# Patient Record
Sex: Female | Born: 1990 | Race: Black or African American | Hispanic: No | Marital: Single | Smoking: Never smoker
Health system: Southern US, Community
[De-identification: ages and names within clinical notes are randomized; demographics above are authoritative.]

## PROBLEM LIST (undated history)

## (undated) ENCOUNTER — Inpatient Hospital Stay (HOSPITAL_COMMUNITY): Payer: Self-pay

## (undated) DIAGNOSIS — G8929 Other chronic pain: Secondary | ICD-10-CM

## (undated) DIAGNOSIS — M549 Dorsalgia, unspecified: Secondary | ICD-10-CM

## (undated) DIAGNOSIS — F419 Anxiety disorder, unspecified: Secondary | ICD-10-CM

## (undated) DIAGNOSIS — R87629 Unspecified abnormal cytological findings in specimens from vagina: Secondary | ICD-10-CM

## (undated) HISTORY — PX: WISDOM TOOTH EXTRACTION: SHX21

---

## 2008-03-01 ENCOUNTER — Emergency Department (HOSPITAL_COMMUNITY): Admission: EM | Admit: 2008-03-01 | Discharge: 2008-03-01 | Payer: Self-pay | Admitting: Emergency Medicine

## 2009-08-22 IMAGING — CR DG LUMBAR SPINE COMPLETE 4+V
5 series · 5 of 5 positions shown · non-contrast
Comparison: None

CLINICAL DATA: Lower back pain

LUMBAR SPINE - COMPLETE 4+ VIEW

[t l-spine a.p.]
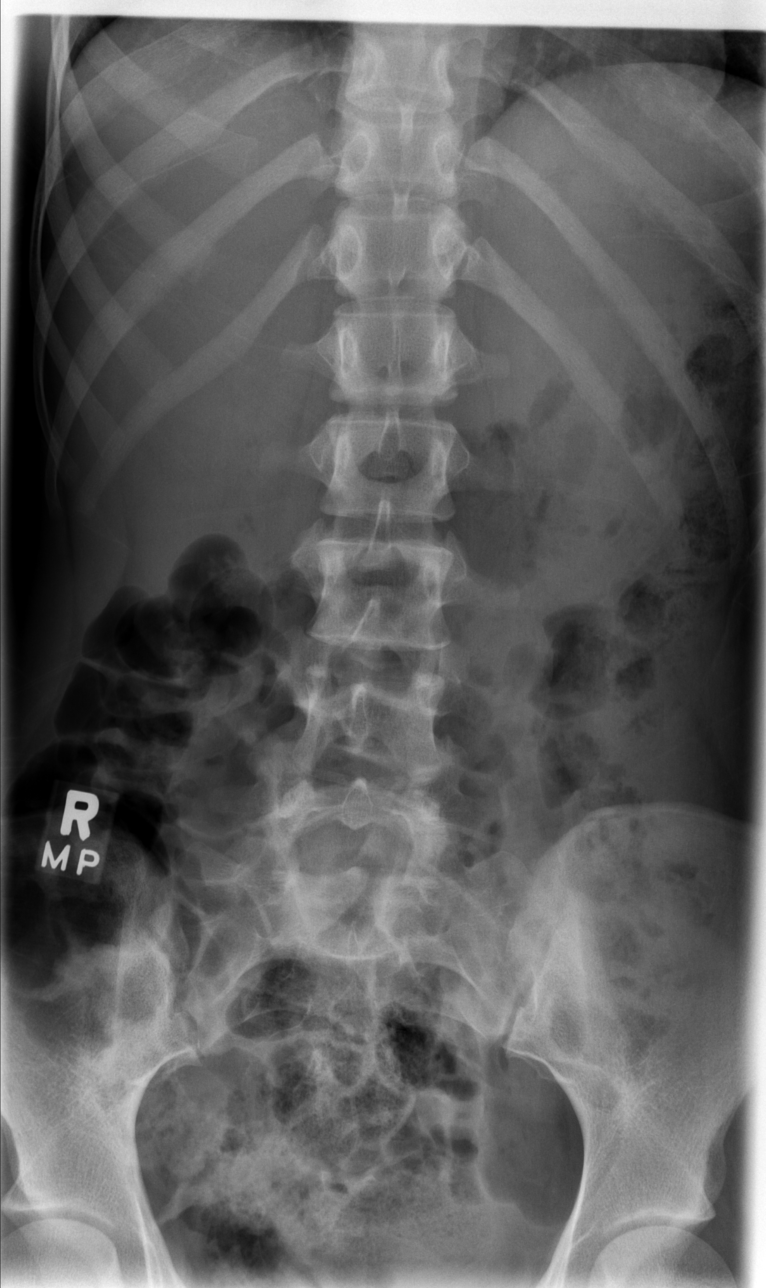

[t l-spine oblique exposure (1 of 2)]
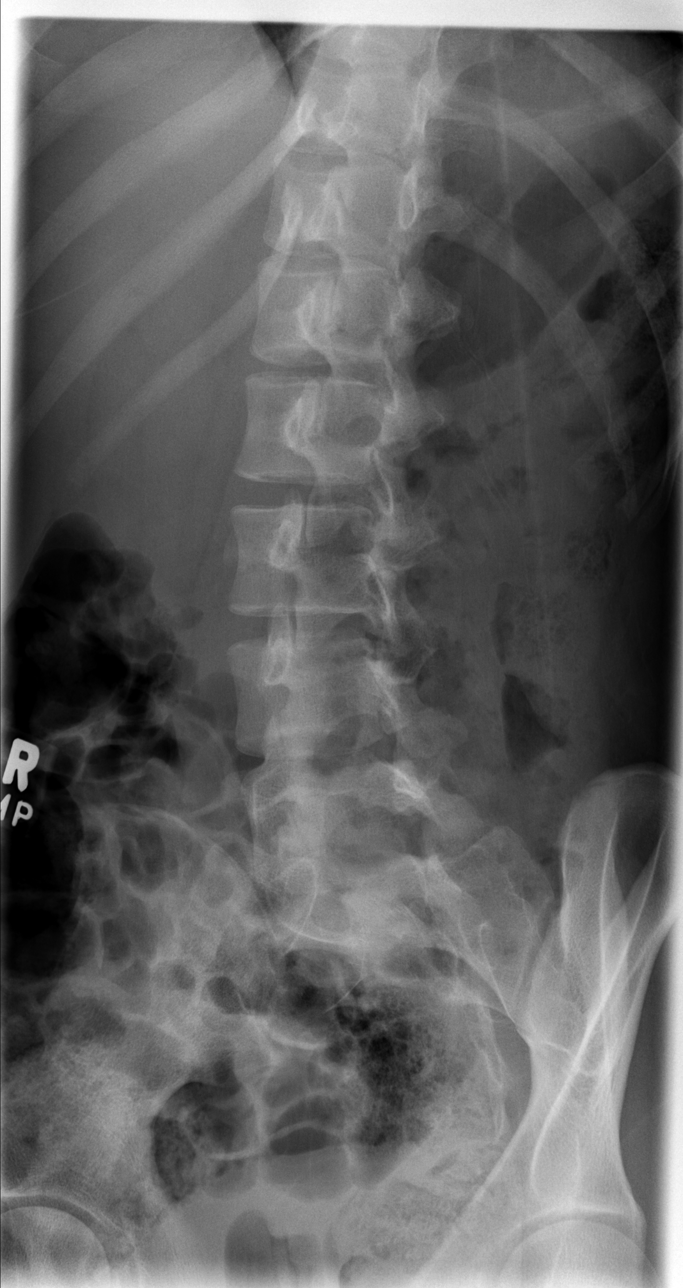

[t l-spine oblique exposure (2 of 2)]
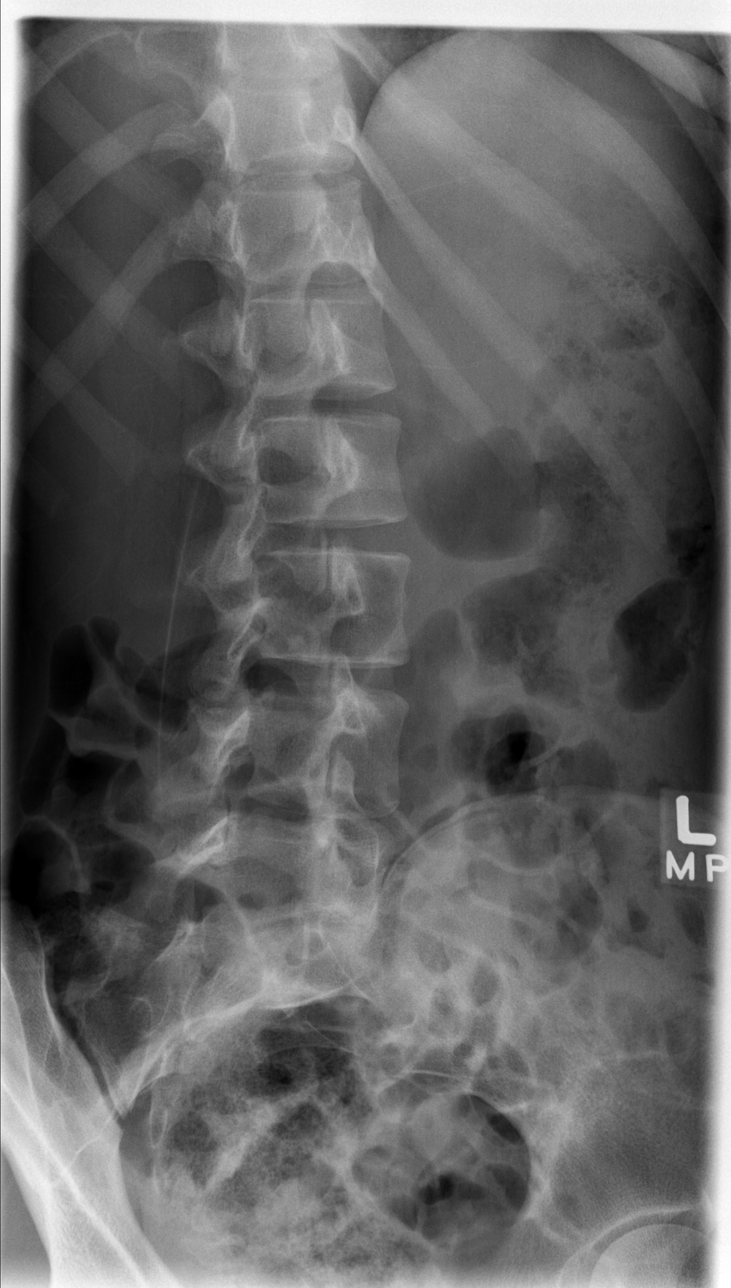

[t l-spine lat]
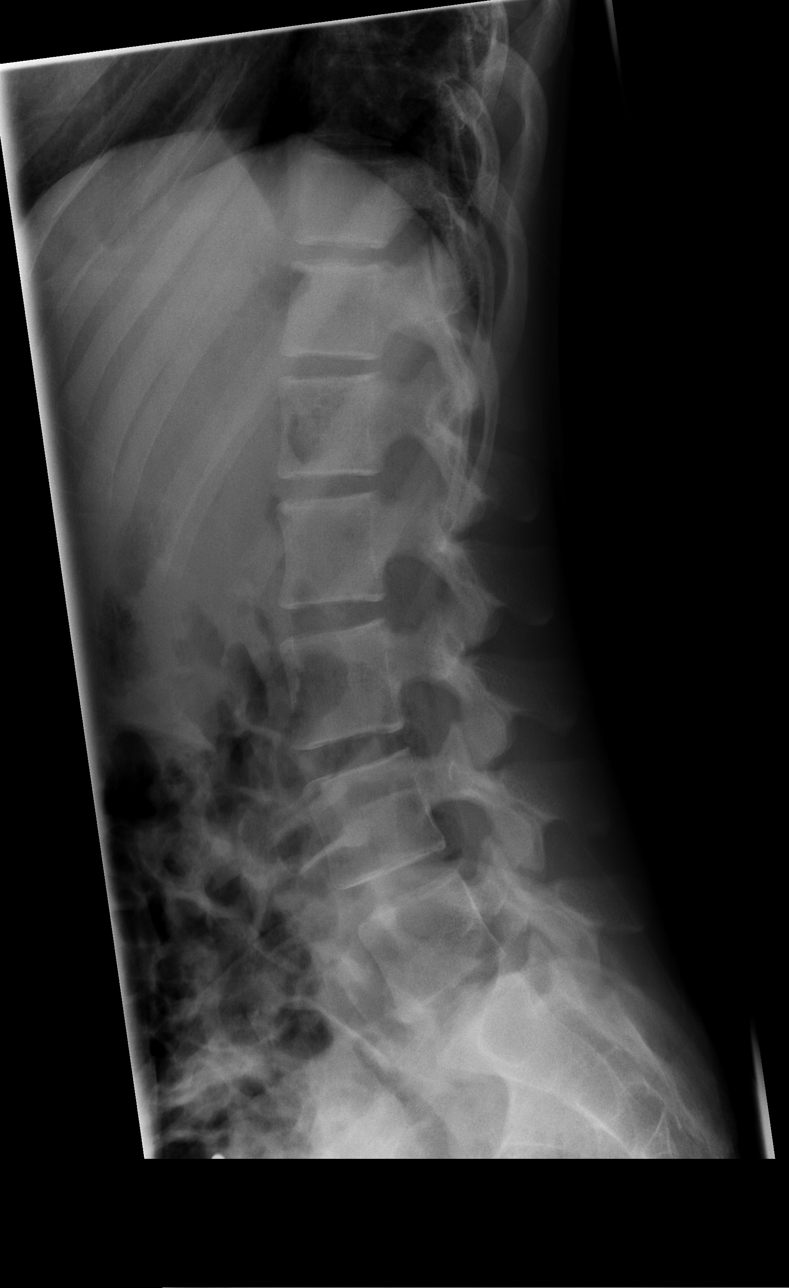

[t l-spine l5-s1 spot]
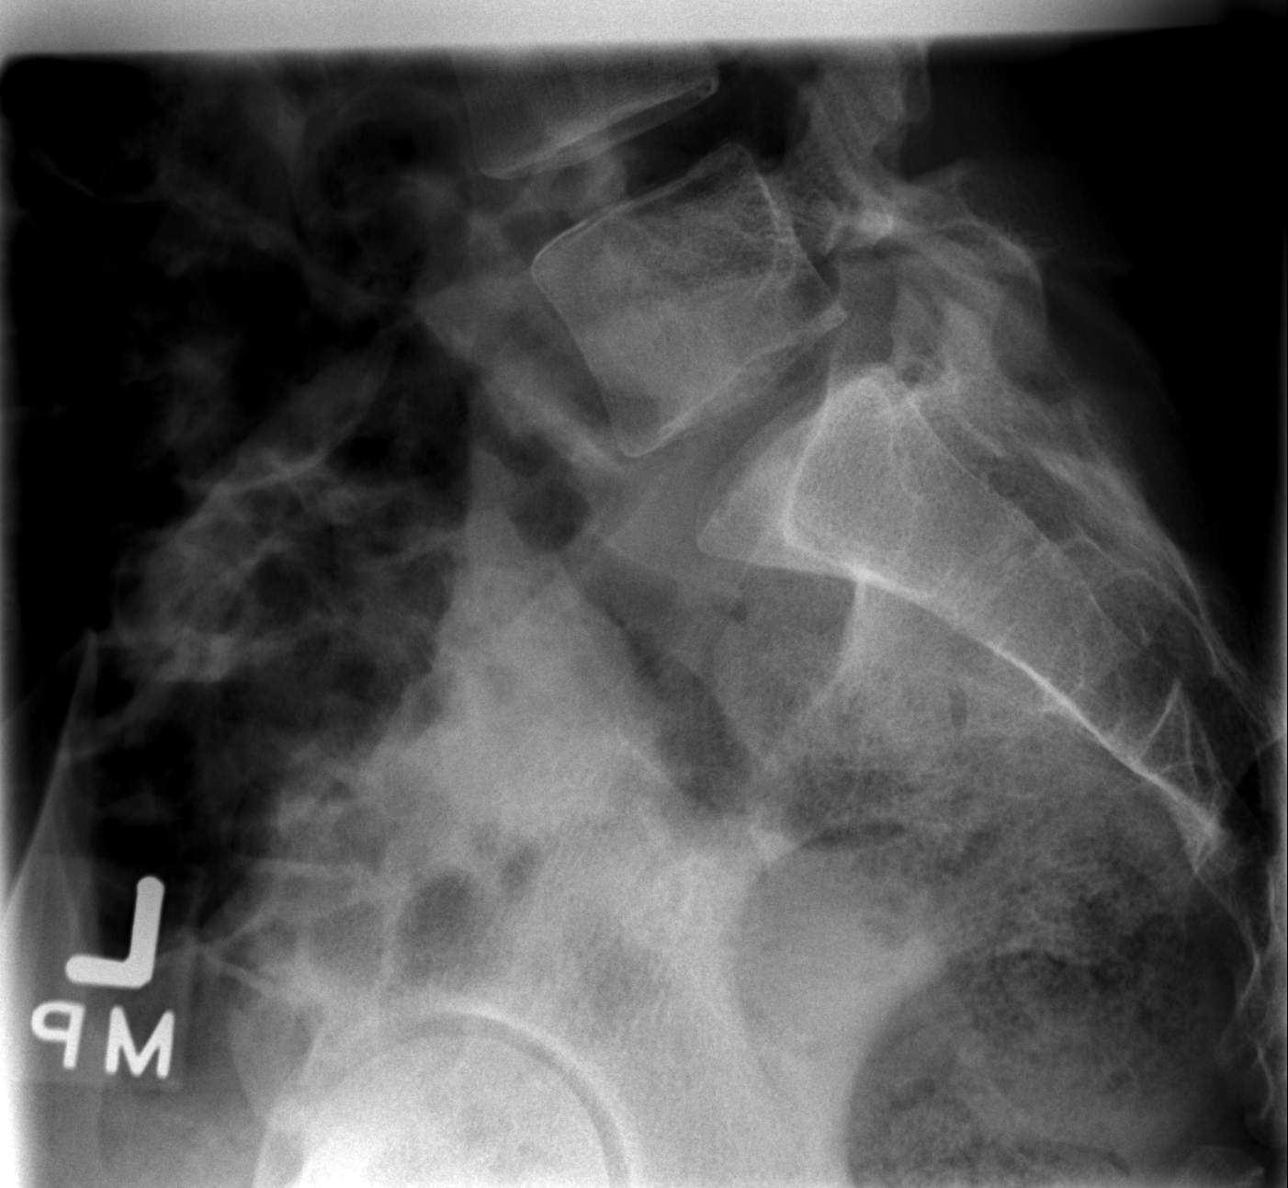

[5 of 5 positions shown; findings below may reference images not displayed]

FINDINGS: No acute bony abnormality.  No fracture or malalignment.
Disc spaces well maintained.
IMPRESSION: No acute findings

## 2014-03-02 ENCOUNTER — Inpatient Hospital Stay (HOSPITAL_COMMUNITY)
Admission: AD | Admit: 2014-03-02 | Discharge: 2014-03-02 | Disposition: A | Discharge status: Home / Self Care | Payer: Medicaid Other | Source: Ambulatory Visit | Attending: Obstetrics and Gynecology | Admitting: Obstetrics and Gynecology

## 2014-03-02 ENCOUNTER — Encounter (HOSPITAL_COMMUNITY): Payer: Self-pay | Admitting: *Deleted

## 2014-03-02 DIAGNOSIS — R109 Unspecified abdominal pain: Secondary | ICD-10-CM | POA: Insufficient documentation

## 2014-03-02 DIAGNOSIS — M549 Dorsalgia, unspecified: Secondary | ICD-10-CM | POA: Insufficient documentation

## 2014-03-02 DIAGNOSIS — O99891 Other specified diseases and conditions complicating pregnancy: Secondary | ICD-10-CM | POA: Insufficient documentation

## 2014-03-02 DIAGNOSIS — O9989 Other specified diseases and conditions complicating pregnancy, childbirth and the puerperium: Principal | ICD-10-CM

## 2014-03-02 HISTORY — DX: Other chronic pain: G89.29

## 2014-03-02 HISTORY — DX: Dorsalgia, unspecified: M54.9

## 2014-03-02 HISTORY — DX: Anxiety disorder, unspecified: F41.9

## 2014-03-02 HISTORY — DX: Unspecified abnormal cytological findings in specimens from vagina: R87.629

## 2014-03-02 LAB — URINALYSIS, ROUTINE W REFLEX MICROSCOPIC
BILIRUBIN URINE: NEGATIVE
Glucose, UA: NEGATIVE mg/dL
HGB URINE DIPSTICK: NEGATIVE
KETONES UR: NEGATIVE mg/dL
Leukocytes, UA: NEGATIVE
NITRITE: NEGATIVE
PH: 7 (ref 5.0–8.0)
Protein, ur: NEGATIVE mg/dL
Specific Gravity, Urine: 1.02 (ref 1.005–1.030)
UROBILINOGEN UA: 0.2 mg/dL (ref 0.0–1.0)

## 2014-03-02 NOTE — MAU Note (Signed)
Had abd pain and back pain this morning- lasted for 3 hrs, none now.

## 2014-03-02 NOTE — Discharge Instructions (Signed)
Second Trimester of Pregnancy The second trimester is from week 13 through week 28, months 4 through 6. The second trimester is often a time when you feel your best. Your body has also adjusted to being pregnant, and you begin to feel better physically. Usually, morning sickness has lessened or quit completely, you may have more energy, and you may have an increase in appetite. The second trimester is also a time when the fetus is growing rapidly. At the end of the sixth month, the fetus is about 9 inches long and weighs about 1 pounds. You will likely begin to feel the baby move (quickening) between 18 and 20 weeks of the pregnancy. BODY CHANGES Your body goes through many changes during pregnancy. The changes vary from woman to woman.   Your weight will continue to increase. You will notice your lower abdomen bulging out.  You may begin to get stretch marks on your hips, abdomen, and breasts.  You may develop headaches that can be relieved by medicines approved by your caregiver.  You may urinate more often because the fetus is pressing on your bladder.  You may develop or continue to have heartburn as a result of your pregnancy.  You may develop constipation because certain hormones are causing the muscles that push waste through your intestines to slow down.  You may develop hemorrhoids or swollen, bulging veins (varicose veins).  You may have back pain because of the weight gain and pregnancy hormones relaxing your joints between the bones in your pelvis and as a result of a shift in weight and the muscles that support your balance.  Your breasts will continue to grow and be tender.  Your gums may bleed and may be sensitive to brushing and flossing.  Dark spots or blotches (chloasma, mask of pregnancy) may develop on your face. This will likely fade after the baby is born.  A dark line from your belly button to the pubic area (linea nigra) may appear. This will likely fade after the  baby is born. WHAT TO EXPECT AT YOUR PRENATAL VISITS During a routine prenatal visit:  You will be weighed to make sure you and the fetus are growing normally.  Your blood pressure will be taken.  Your abdomen will be measured to track your baby's growth.  The fetal heartbeat will be listened to.  Any test results from the previous visit will be discussed. Your caregiver may ask you:  How you are feeling.  If you are feeling the baby move.  If you have had any abnormal symptoms, such as leaking fluid, bleeding, severe headaches, or abdominal cramping.  If you have any questions. Other tests that may be performed during your second trimester include:  Blood tests that check for:  Low iron levels (anemia).  Gestational diabetes (between 24 and 28 weeks).  Rh antibodies.  Urine tests to check for infections, diabetes, or protein in the urine.  An ultrasound to confirm the proper growth and development of the baby.  An amniocentesis to check for possible genetic problems.  Fetal screens for spina bifida and Down syndrome. HOME CARE INSTRUCTIONS   Avoid all smoking, herbs, alcohol, and unprescribed drugs. These chemicals affect the formation and growth of the baby.  Follow your caregiver's instructions regarding medicine use. There are medicines that are either safe or unsafe to take during pregnancy.  Exercise only as directed by your caregiver. Experiencing uterine cramps is a good sign to stop exercising.  Continue to eat regular,   healthy meals.  Wear a good support bra for breast tenderness.  Do not use hot tubs, steam rooms, or saunas.  Wear your seat belt at all times when driving.  Avoid raw meat, uncooked cheese, cat litter boxes, and soil used by cats. These carry germs that can cause birth defects in the baby.  Take your prenatal vitamins.  Try taking a stool softener (if your caregiver approves) if you develop constipation. Eat more high-fiber foods,  such as fresh vegetables or fruit and whole grains. Drink plenty of fluids to keep your urine clear or pale yellow.  Take warm sitz baths to soothe any pain or discomfort caused by hemorrhoids. Use hemorrhoid cream if your caregiver approves.  If you develop varicose veins, wear support hose. Elevate your feet for 15 minutes, 3 4 times a day. Limit salt in your diet.  Avoid heavy lifting, wear low heel shoes, and practice good posture.  Rest with your legs elevated if you have leg cramps or low back pain.  Visit your dentist if you have not gone yet during your pregnancy. Use a soft toothbrush to brush your teeth and be gentle when you floss.  A sexual relationship may be continued unless your caregiver directs you otherwise.  Continue to go to all your prenatal visits as directed by your caregiver. SEEK MEDICAL CARE IF:   You have dizziness.  You have mild pelvic cramps, pelvic pressure, or nagging pain in the abdominal area.  You have persistent nausea, vomiting, or diarrhea.  You have a bad smelling vaginal discharge.  You have pain with urination. SEEK IMMEDIATE MEDICAL CARE IF:   You have a fever.  You are leaking fluid from your vagina.  You have spotting or bleeding from your vagina.  You have severe abdominal cramping or pain.  You have rapid weight gain or loss.  You have shortness of breath with chest pain.  You notice sudden or extreme swelling of your face, hands, ankles, feet, or legs.  You have not felt your baby move in over an hour.  You have severe headaches that do not go away with medicine.  You have vision changes. Document Released: 11/09/2001 Document Revised: 07/18/2013 Document Reviewed: 01/16/2013 [employer] Patient Information 2014 [employer]  

## 2014-03-02 NOTE — MAU Provider Note (Signed)
History   23 yo, G1P0 at 4715w0d presents with intermittent sharp lower abdominal pain for about 3 hours this am.  Pain resolved, no medication was taken.  Pt also reports continued back pain that was present prior to pregnancy.  Denies VB, recent fever, resp or GI c/o's, UTI or s/s.  Recent office visit pt was told she had a positive genetic screen, Panorama was drawn.  There are no active problems to display for this patient.   Chief Complaint  Patient presents with  . Abdominal Pain  . Back Pain   HPI  OB History   Grav Para Term Preterm Abortions TAB SAB Ect Mult Living   1               Past Medical History  Diagnosis Date  . Back pain, chronic   . Anxiety     no official dx or meds  . Vaginal Pap smear, abnormal     follow ups normal    Past Surgical History  Procedure Laterality Date  . Wisdom tooth extraction      Family History  Problem Relation Age of Onset  . Hypertension Father   . Hearing loss Neg Hx     History  Substance Use Topics  . Smoking status: Never Smoker   . Smokeless tobacco: Never Used  . Alcohol Use: Yes     Comment: prior to preg    Allergies: No Known Allergies  Prescriptions prior to admission  Medication Sig Dispense Refill  . Prenatal Vit-Fe Fumarate-FA (PRENATAL MULTIVITAMIN) TABS tablet Take 1 tablet by mouth daily at 12 noon.        ROS ROS: see HPI above, all other systems are negative  Physical Exam   Blood pressure 114/69, pulse 92, temperature 98.2 F (36.8 C), temperature source Oral, resp. rate 18, height 5' 9.5" (1.765 m), weight 131 lb 12.8 oz (59.784 kg).  Physical Exam Chest: Clear Heart: RRR Abdomen: gravid, NT Extremities: WNL  FHT: Doppler 153  ED Course  IUP at 8515w0d Abdominal pain that resolved prior to admission  Reassurances given D/c home with precautions   Haroldine LawsOXLEY, Borden Thune CNM, MSN 03/02/2014 2:13 PM

## 2014-09-30 ENCOUNTER — Encounter (HOSPITAL_COMMUNITY): Payer: Self-pay | Admitting: *Deleted

## 2015-01-05 ENCOUNTER — Encounter (HOSPITAL_COMMUNITY): Payer: Self-pay | Admitting: *Deleted
